# Patient Record
Sex: Female | Born: 1995 | Race: White | Hispanic: No | Marital: Single | Smoking: Never smoker
Health system: Southern US, Community
[De-identification: ages and names within clinical notes are randomized; demographics above are authoritative.]

## PROBLEM LIST (undated history)

## (undated) DIAGNOSIS — F419 Anxiety disorder, unspecified: Secondary | ICD-10-CM

## (undated) HISTORY — DX: Anxiety disorder, unspecified: F41.9

---
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
## 2016-03-03 ENCOUNTER — Telehealth: Payer: Self-pay

## 2016-03-03 NOTE — Telephone Encounter (Signed)
Patient is out medicine needs them refilled.  Pharm sent over request per mom

## 2016-03-05 NOTE — Telephone Encounter (Signed)
Called and discussed with pt's mother that we haven't seen pt in over a year and haven't even gotten her RFs since Jan which would have run out in April. Asked mother who has been Rxing it since then. Mother stated that maybe her health clinic at school has been but they have not been able to get in touch with them. Pt has an appt to est care with Nadyne CoombesKaren Richter, MD on Monday and she would like a few tablets to cover her until then. I advised that I can send a message to the providers but it is really not safe medical practice to Rx medications if the pt hasn't been seen for 15 mos. Encouraged mother to have pt come in tomorrow for same day appt. Transferred to operator to set up appt.

## 2016-03-06 ENCOUNTER — Ambulatory Visit (INDEPENDENT_AMBULATORY_CARE_PROVIDER_SITE_OTHER): Payer: PRIVATE HEALTH INSURANCE | Admitting: Urgent Care

## 2016-03-06 ENCOUNTER — Encounter: Payer: Self-pay | Admitting: Urgent Care

## 2016-03-06 VITALS — BP 114/72 | HR 87 | Temp 98.6°F | Resp 17 | Ht 63.0 in | Wt 123.0 lb

## 2016-03-06 DIAGNOSIS — R5383 Other fatigue: Secondary | ICD-10-CM | POA: Diagnosis not present

## 2016-03-06 DIAGNOSIS — F411 Generalized anxiety disorder: Secondary | ICD-10-CM | POA: Diagnosis not present

## 2016-03-06 DIAGNOSIS — F329 Major depressive disorder, single episode, unspecified: Secondary | ICD-10-CM

## 2016-03-06 DIAGNOSIS — F32A Depression, unspecified: Secondary | ICD-10-CM

## 2016-03-06 MED ORDER — BUPROPION HCL ER (XL) 300 MG PO TB24: 300.0000 mg | ORAL_TABLET | Freq: Every day | ORAL | 0 refills | Status: AC

## 2016-03-06 MED ORDER — CITALOPRAM HYDROBROMIDE 20 MG PO TABS: 20.0000 mg | ORAL_TABLET | Freq: Every day | ORAL | 0 refills | Status: AC

## 2016-03-06 NOTE — Patient Instructions (Addendum)
Bupropion extended-release tablets (Depression/Mood Disorders)  What is this medicine?  BUPROPION (byoo PROE pee on) is used to treat depression.  This medicine may be used for other purposes; ask your health care provider or pharmacist if you have questions.  What should I tell my health care provider before I take this medicine?  They need to know if you have any of these conditions:  -an eating disorder, such as anorexia or bulimia  -bipolar disorder or psychosis  -diabetes or high blood sugar, treated with medication  -glaucoma  -head injury or brain tumor  -heart disease, previous heart attack, or irregular heart beat  -high blood pressure  -kidney or liver disease  -seizures (convulsions)  -suicidal thoughts or a previous suicide attempt  -Tourette's syndrome  -weight loss  -an unusual or allergic reaction to bupropion, other medicines, foods, dyes, or preservatives  -breast-feeding  -pregnant or trying to become pregnant  How should I use this medicine?  Take this medicine by mouth with a glass of water. Follow the directions on the prescription label. You can take it with or without food. If it upsets your stomach, take it with food. Do not crush, chew, or cut these tablets. This medicine is taken once daily at the same time each day. Do not take your medicine more often than directed. Do not stop taking this medicine suddenly except upon the advice of your doctor. Stopping this medicine too quickly may cause serious side effects or your condition may worsen.  A special MedGuide will be given to you by the pharmacist with each prescription and refill. Be sure to read this information carefully each time.  Talk to your pediatrician regarding the use of this medicine in children. Special care may be needed.  Overdosage: If you think you have taken too much of this medicine contact a poison control center or emergency room at once.  NOTE: This medicine is only for you. Do not share this medicine with  others.  What if I miss a dose?  If you miss a dose, skip the missed dose and take your next tablet at the regular time. Do not take double or extra doses.  What may interact with this medicine?  Do not take this medicine with any of the following medications:  -linezolid  -MAOIs like Azilect, Carbex, Eldepryl, Marplan, Nardil, and Parnate  -methylene blue (injected into a vein)  -other medicines that contain bupropion like Zyban  This medicine may also interact with the following medications:  -alcohol  -certain medicines for anxiety or sleep  -certain medicines for blood pressure like metoprolol, propranolol  -certain medicines for depression or psychotic disturbances  -certain medicines for HIV or AIDS like efavirenz, lopinavir, nelfinavir, ritonavir  -certain medicines for irregular heart beat like propafenone, flecainide  -certain medicines for Parkinson's disease like amantadine, levodopa  -certain medicines for seizures like carbamazepine, phenytoin, phenobarbital  -cimetidine  -clopidogrel  -cyclophosphamide  -furazolidone  -isoniazid  -nicotine  -orphenadrine  -procarbazine  -steroid medicines like prednisone or cortisone  -stimulant medicines for attention disorders, weight loss, or to stay awake  -tamoxifen  -theophylline  -thiotepa  -ticlopidine  -tramadol  -warfarin  This list may not describe all possible interactions. Give your health care provider a list of all the medicines, herbs, non-prescription drugs, or dietary supplements you use. Also tell them if you smoke, drink alcohol, or use illegal drugs. Some items may interact with your medicine.  What should I watch for while using this medicine?    for new or worsening thoughts of suicide or depression. Also watch out for sudden changes in feelings such as feeling anxious, agitated, panicky, irritable, hostile, aggressive, impulsive, severely restless, overly excited and hyperactive, or not being able to sleep. If this happens, especially at the beginning of treatment or after a change in dose, call your health care professional. Avoid alcoholic drinks while taking this medicine. Drinking large amounts of alcoholic beverages, using sleeping or anxiety medicines, or quickly stopping the use of these agents while taking this medicine may increase your risk for a seizure. Do not drive or use heavy machinery until you know how this medicine affects you. This medicine can impair your ability to perform these tasks. Do not take this medicine close to bedtime. It may prevent you from sleeping. Your mouth may get dry. Chewing sugarless gum or sucking hard candy, and drinking plenty of water may help. Contact your doctor if the problem does not go away or is severe. The tablet shell for some brands of this medicine does not dissolve. This is normal. The tablet shell may appear whole in the stool. This is not a cause for concern. What side effects may I notice from receiving this medicine? Side effects that you should report to your doctor or health care professional as soon as possible: -allergic reactions like skin rash, itching or hives, swelling of the face, lips, or tongue -breathing problems -changes in vision -confusion -fast or irregular heartbeat -hallucinations -increased blood pressure -redness, blistering, peeling or loosening of the skin, including inside the mouth -seizures -suicidal thoughts or other mood changes -unusually weak or tired -vomiting Side effects that usually do not require medical attention (report to your doctor or health care professional if they continue or are  bothersome): -change in sex drive or performance -constipation -headache -loss of appetite -nausea -tremors -weight loss This list may not describe all possible side effects. Call your doctor for medical advice about side effects. You may report side effects to FDA at 1-800-FDA-1088. Where should I keep my medicine? Keep out of the reach of children. Store at room temperature between 15 and 30 degrees C (59 and 86 degrees F). Throw away any unused medicine after the expiration date. NOTE: This sheet is a summary. It may not cover all possible information. If you have questions about this medicine, talk to your doctor, pharmacist, or health care provider.    2016, Elsevier/Gold Standard. (2013-01-28 12:39:42)    Citalopram tablets What is this medicine? CITALOPRAM (sye TAL oh pram) is a medicine for depression. This medicine may be used for other purposes; ask your health care provider or pharmacist if you have questions. What should I tell my health care provider before I take this medicine? They need to know if you have any of these conditions: -bipolar disorder or a family history of bipolar disorder -diabetes -glaucoma -heart disease -history of irregular heartbeat -kidney or liver disease -low levels of magnesium or potassium in the blood -receiving electroconvulsive therapy -seizures (convulsions) -suicidal thoughts or a previous suicide attempt -an unusual or allergic reaction to citalopram, escitalopram, other medicines, foods, dyes, or preservatives -pregnant or trying to become pregnant -breast-feeding How should I use this medicine? Take this medicine by mouth with a glass of water. Follow the directions on the prescription label. You can take it with or without food. Take your medicine at regular intervals. Do not take your medicine more often than directed. Do not stop taking this medicine suddenly except upon the advice  of your doctor. Stopping this medicine too  quickly may cause serious side effects or your condition may worsen. A special MedGuide will be given to you by the pharmacist with each prescription and refill. Be sure to read this information carefully each time. Talk to your pediatrician regarding the use of this medicine in children. Special care may be needed. Patients over 33 years old may have a stronger reaction and need a smaller dose. Overdosage: If you think you have taken too much of this medicine contact a poison control center or emergency room at once. NOTE: This medicine is only for you. Do not share this medicine with others. What if I miss a dose? If you miss a dose, take it as soon as you can. If it is almost time for your next dose, take only that dose. Do not take double or extra doses. What may interact with this medicine? Do not take this medicine with any of the following medications: -certain medicines for fungal infections like fluconazole, itraconazole, ketoconazole, posaconazole, voriconazole -cisapride -dofetilide -dronedarone -escitalopram -linezolid -MAOIs like Carbex, Eldepryl, Marplan, Nardil, and Parnate -methylene blue (injected into a vein) -pimozide -thioridazine -ziprasidone This medicine may also interact with the following medications: -alcohol -aspirin and aspirin-like medicines -carbamazepine -certain medicines for depression, anxiety, or psychotic disturbances -certain medicines for infections like chloroquine, clarithromycin, erythromycin, furazolidone, isoniazid, pentamidine -certain medicines for migraine headaches like almotriptan, eletriptan, frovatriptan, naratriptan, rizatriptan, sumatriptan, zolmitriptan -certain medicines for sleep -certain medicines that treat or prevent blood clots like dalteparin, enoxaparin, warfarin -cimetidine -diuretics -fentanyl -lithium -methadone -metoprolol -NSAIDs, medicines for pain and inflammation, like ibuprofen or naproxen -omeprazole -other  medicines that prolong the QT interval (cause an abnormal heart rhythm) -procarbazine -rasagiline -supplements like St. John's wort, kava kava, valerian -tramadol -tryptophan This list may not describe all possible interactions. Give your health care provider a list of all the medicines, herbs, non-prescription drugs, or dietary supplements you use. Also tell them if you smoke, drink alcohol, or use illegal drugs. Some items may interact with your medicine. What should I watch for while using this medicine? Tell your doctor if your symptoms do not get better or if they get worse. Visit your doctor or health care professional for regular checks on your progress. Because it may take several weeks to see the full effects of this medicine, it is important to continue your treatment as prescribed by your doctor. Patients and their families should watch out for new or worsening thoughts of suicide or depression. Also watch out for sudden changes in feelings such as feeling anxious, agitated, panicky, irritable, hostile, aggressive, impulsive, severely restless, overly excited and hyperactive, or not being able to sleep. If this happens, especially at the beginning of treatment or after a change in dose, call your health care professional. Bonita Quin may get drowsy or dizzy. Do not drive, use machinery, or do anything that needs mental alertness until you know how this medicine affects you. Do not stand or sit up quickly, especially if you are an older patient. This reduces the risk of dizzy or fainting spells. Alcohol may interfere with the effect of this medicine. Avoid alcoholic drinks. Your mouth may get dry. Chewing sugarless gum or sucking hard candy, and drinking plenty of water will help. Contact your doctor if the problem does not go away or is severe. What side effects may I notice from receiving this medicine? Side effects that you should report to your doctor or health care professional as soon as  possible: -allergic reactions like skin rash, itching or hives, swelling of the face, lips, or tongue -chest pain -confusion -dizziness -fast, irregular heartbeat -fast talking and excited feelings or actions that are out of control -feeling faint or lightheaded, falls -hallucination, loss of contact with reality -seizures -shortness of breath -suicidal thoughts or other mood changes -unusual bleeding or bruising Side effects that usually do not require medical attention (report to your doctor or health care professional if they continue or are bothersome): -blurred vision -change in appetite -change in sex drive or performance -headache -increased sweating -nausea -trouble sleeping This list may not describe all possible side effects. Call your doctor for medical advice about side effects. You may report side effects to FDA at 1-800-FDA-1088. Where should I keep my medicine? Keep out of reach of children. Store at room temperature between 15 and 30 degrees C (59 and 86 degrees F). Throw away any unused medicine after the expiration date. NOTE: This sheet is a summary. It may not cover all possible information. If you have questions about this medicine, talk to your doctor, pharmacist, or health care provider.    2016, Elsevier/Gold Standard. (2013-01-28 13:19:48)    IF you received an x-ray today, you will receive an invoice from [facility]. Please contact [facility] at [phone] with questions or concerns regarding your invoice.   IF you received labwork today, you will receive an invoice from [facility]. Please contact [facility] at [phone] with questions or concerns regarding your invoice.   Our billing staff will not be able to assist you with questions regarding bills from these companies.  You will be contacted with the lab results as soon as they are available. The fastest way to get your results is to activate your My  Chart account. Instructions are located on the last page of this paperwork. If you have not heard from us regarding the results in 2 weeks, please contact this office.

## 2016-03-06 NOTE — Progress Notes (Signed)
    MRN: [id number] DOB: 11/05/1995  Subjective:   Nichole Stevenson is a 20 y.o. female presenting for chief complaint of Medication Refill (wellbutrin and celexa)  Patient takes Wellbutrin and Celexa for management of anxiety. [place]. Has been using CBT with good success. Patient plans of establishing care with a new provider while here on school break as recommended by Dr. Merla Richesoolittle. Today, she reports that she has been out of her medications for a week and is having a hard time with fatigue, feeling down, decreased appetite, weight loss. She has had very passive thoughts about death but denies SI, HI.   Nichole Stevenson has a current medication list which includes the following prescription(s): bupropion, citalopram, magnesium gluconate, meloxicam, and multivitamin. Also is allergic to omnicef [cefdinir].  Nichole Stevenson  has a past medical history of Anxiety. Also  has no past surgical history on file.  Objective:   Vitals: BP 114/72 (BP Location: Left Arm, Patient Position: Sitting, Cuff Size: Normal)   Pulse 87   Temp 98.6 F (37 C) (Oral)   Resp 17   Ht 5\' 3"  (1.6 m)   Wt 123 lb (55.8 kg)   LMP 02/28/2016 (Approximate)   SpO2 97%   BMI 21.79 kg/m   Wt Readings from Last 3 Encounters:  03/06/16 123 lb (55.8 kg) (43 %, Z= -0.17)*  11/25/14 133 lb (60.3 kg) (67 %, Z= 0.44)*  11/01/14 136 lb (61.7 kg) (71 %, Z= 0.56)*   * Growth percentiles are based on CDC 2-20 Years data.    Physical Exam  Constitutional: She is oriented to person, place, and time. She appears well-developed and well-nourished.  HENT:  Mouth/Throat: Oropharynx is clear and moist.  Neck: Normal range of motion. Neck supple. No thyromegaly present.  Cardiovascular: Normal rate, regular rhythm and intact distal pulses.  Exam reveals no gallop and no friction rub.   No murmur heard. Pulmonary/Chest: No respiratory distress. She has no wheezes. She has no rales.    Neurological: She is alert and oriented to person, place, and time.  Skin: Skin is warm and dry.  Psychiatric: Judgment normal. Her mood appears not anxious. Her speech is not rapid and/or pressured and not delayed. She is not slowed. She exhibits a depressed mood (flat affect). She expresses no homicidal and no suicidal ideation.   Assessment and Plan :   1. Other fatigue 2. Generalized anxiety disorder 3. Depression - Labs pending, discussed different medical therapies with patient. I will print her results for her records and review with her new provider. Patient is to rtc as needed.  Wallis BambergMario Cinque Begley, PA-C [facility] [phone] 03/06/2016 5:14 PM

## 2016-03-07 ENCOUNTER — Encounter: Payer: Self-pay | Admitting: Urgent Care

## 2016-03-07 LAB — CBC
HCT: 43.1 % (ref 35.0–45.0)
Hemoglobin: 14.7 g/dL (ref 11.7–15.5)
MCH: 29.7 pg (ref 27.0–33.0)
MCHC: 34.1 g/dL (ref 32.0–36.0)
MCV: 87.1 fL (ref 80.0–100.0)
MPV: 9.5 fL (ref 7.5–12.5)
PLATELETS: 275 10*3/uL (ref 140–400)
RBC: 4.95 MIL/uL (ref 3.80–5.10)
RDW: 12.1 % (ref 11.0–15.0)
WBC: 9.6 10*3/uL (ref 3.8–10.8)

## 2016-03-07 LAB — TSH: TSH: 2.39 mIU/L (ref 0.50–4.30)
